# Patient Record
Sex: Female | Born: 2000 | Race: Black or African American | Hispanic: No | Marital: Single
Health system: Southern US, Community
[De-identification: ages and names within clinical notes are randomized; demographics above are authoritative.]

---
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
## 2022-05-22 IMAGING — US US BREAST*L* LIMITED INC AXILLA
1 series · 4 of 4 positions shown · non-contrast
Comparison: Previous exam(s).

CLINICAL DATA: One year follow-up for probably benign mass in the
LEFT breast. Patient reports mass is slightly smaller.

EXAM:
ULTRASOUND OF THE LEFT BREAST

[Series 1: us breast*left* limited inc axilla · 0.06mm/px · 4 of 4 slices shown]
[im 1/4]
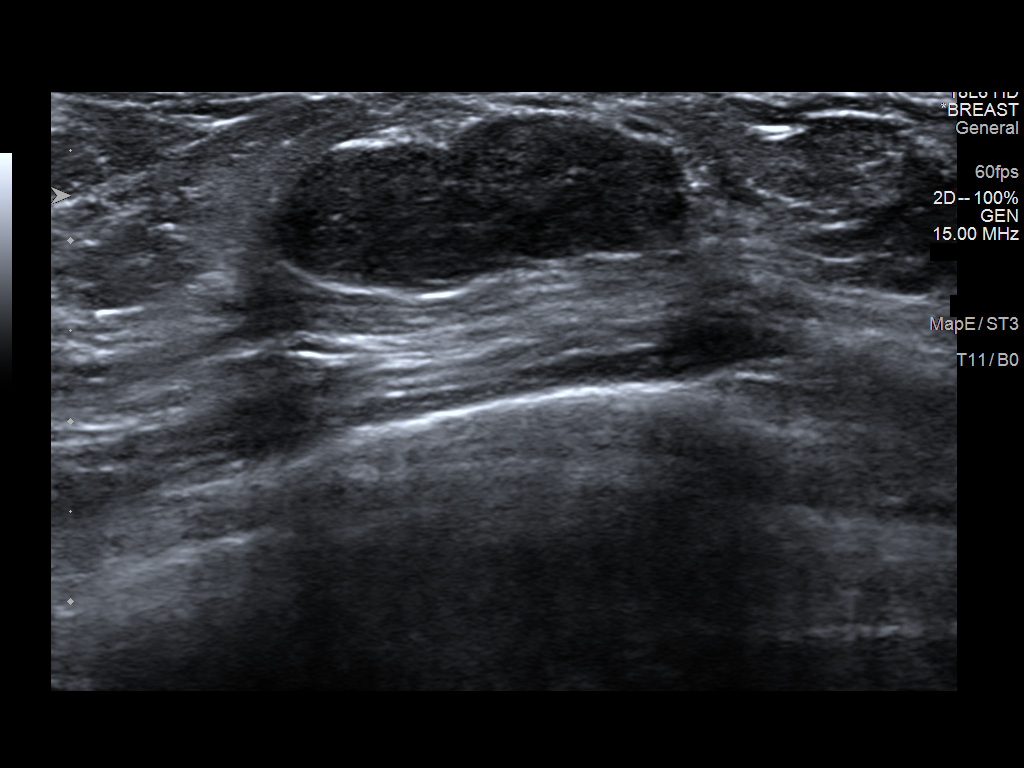
[im 2/4]
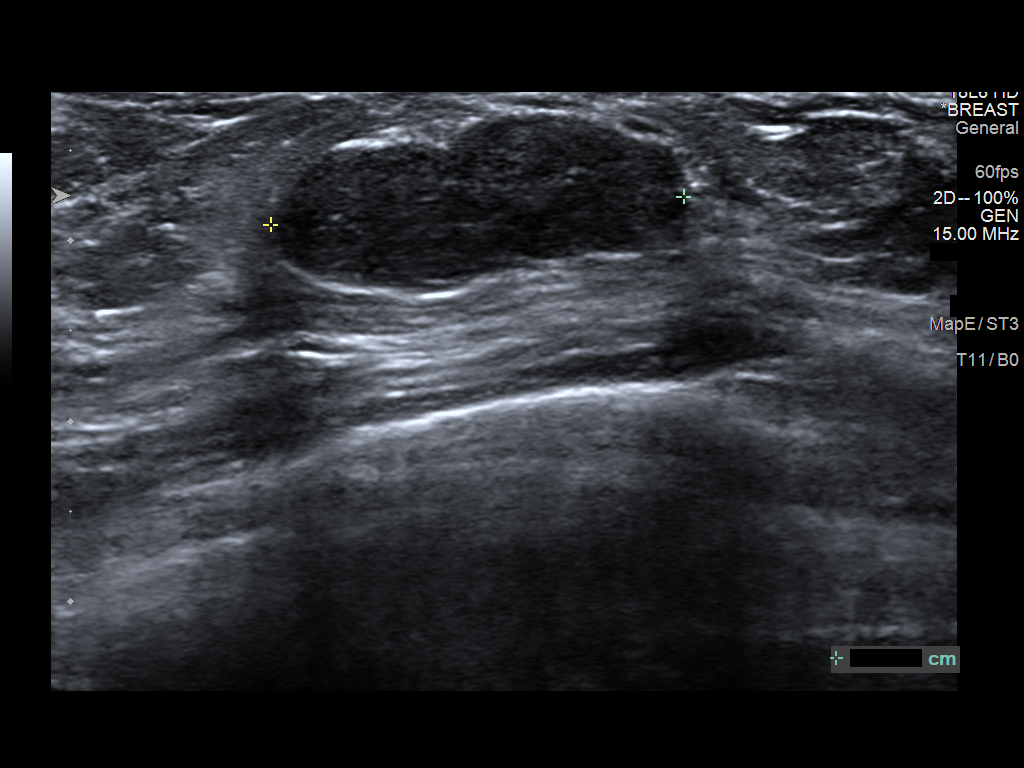
[im 3/4]
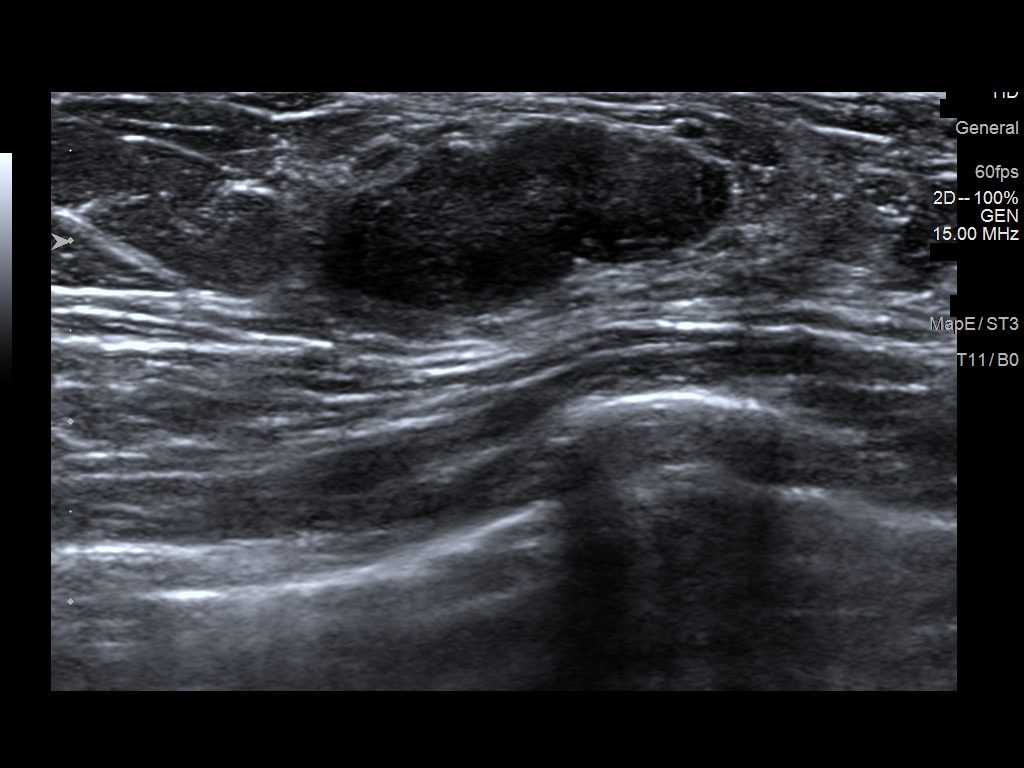
[im 4/4]
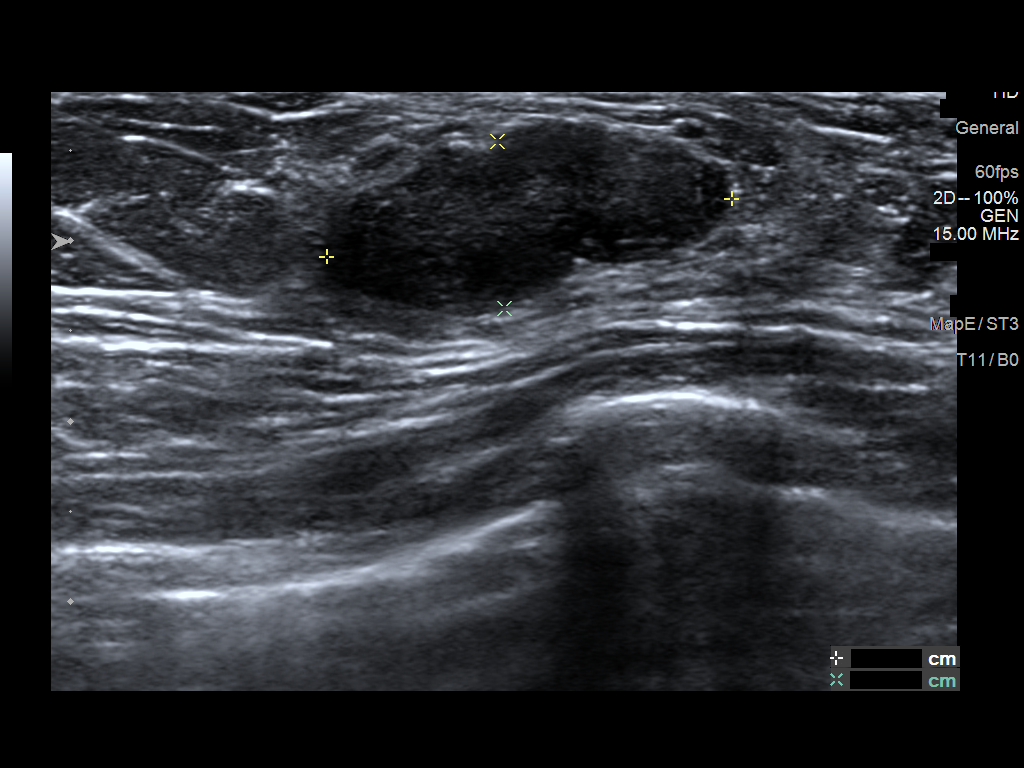

[4 of 4 positions shown; findings below may reference images not displayed]

FINDINGS: Targeted ultrasound is performed, showing a circumscribed oval
hypoechoic parallel mass in the 2 o'clock location of the LEFT
breast 8 centimeters from the nipple. Today mass measures 2.3 x
x 2.3 centimeters. At the time of original exam, mass measured 2.2 x
2.5 x 1.1 centimeters.
IMPRESSION: Stable to slightly smaller size of probable fibroadenoma in the LEFT
breast. There is no sonographic evidence for malignancy.

RECOMMENDATION:
One additional LEFT breast ultrasound exam is recommended in 1 year
to complete follow-up.

I have discussed the findings and recommendations with the patient.
If applicable, a reminder letter will be sent to the patient
regarding the next appointment.

BI-RADS CATEGORY  3: Probably benign.
